# Patient Record
Sex: Female | Born: 1965 | Race: White | Hispanic: No | Smoking: Never smoker
Health system: Southern US, Community
[De-identification: ages and names within clinical notes are randomized; demographics above are authoritative.]

## PROBLEM LIST (undated history)

## (undated) DIAGNOSIS — R7989 Other specified abnormal findings of blood chemistry: Secondary | ICD-10-CM

## (undated) DIAGNOSIS — R7309 Other abnormal glucose: Secondary | ICD-10-CM

## (undated) DIAGNOSIS — T7840XA Allergy, unspecified, initial encounter: Secondary | ICD-10-CM

## (undated) HISTORY — DX: Other specified abnormal findings of blood chemistry: R79.89

## (undated) HISTORY — DX: Other abnormal glucose: R73.09

## (undated) HISTORY — DX: Allergy, unspecified, initial encounter: T78.40XA

---

## 1998-12-23 ENCOUNTER — Other Ambulatory Visit: Admission: RE | Admit: 1998-12-23 | Discharge: 1998-12-23 | Payer: Self-pay | Admitting: Obstetrics and Gynecology

## 1999-08-20 ENCOUNTER — Inpatient Hospital Stay (HOSPITAL_COMMUNITY): Admission: AD | Admit: 1999-08-20 | Discharge: 1999-08-23 | Payer: Self-pay | Admitting: Obstetrics and Gynecology

## 2000-01-02 ENCOUNTER — Other Ambulatory Visit: Admission: RE | Admit: 2000-01-02 | Discharge: 2000-01-02 | Payer: Self-pay | Admitting: Obstetrics and Gynecology

## 2001-10-20 ENCOUNTER — Other Ambulatory Visit: Admission: RE | Admit: 2001-10-20 | Discharge: 2001-10-20 | Payer: Self-pay | Admitting: Obstetrics and Gynecology

## 2001-10-20 ENCOUNTER — Other Ambulatory Visit: Admission: RE | Admit: 2001-10-20 | Discharge: 2001-10-20 | Payer: Self-pay | Admitting: *Deleted

## 2002-01-30 ENCOUNTER — Other Ambulatory Visit: Admission: RE | Admit: 2002-01-30 | Discharge: 2002-01-30 | Payer: Self-pay | Admitting: Obstetrics and Gynecology

## 2002-04-05 ENCOUNTER — Inpatient Hospital Stay (HOSPITAL_COMMUNITY): Admission: AD | Admit: 2002-04-05 | Discharge: 2002-04-05 | Payer: Self-pay | Admitting: Obstetrics and Gynecology

## 2002-04-06 ENCOUNTER — Inpatient Hospital Stay (HOSPITAL_COMMUNITY): Admission: AD | Admit: 2002-04-06 | Discharge: 2002-04-08 | Payer: Self-pay | Admitting: Obstetrics and Gynecology

## 2002-07-23 ENCOUNTER — Other Ambulatory Visit: Admission: RE | Admit: 2002-07-23 | Discharge: 2002-07-23 | Payer: Self-pay | Admitting: Obstetrics and Gynecology

## 2003-04-21 ENCOUNTER — Other Ambulatory Visit: Admission: RE | Admit: 2003-04-21 | Discharge: 2003-04-21 | Payer: Self-pay | Admitting: Obstetrics and Gynecology

## 2015-04-25 ENCOUNTER — Other Ambulatory Visit: Payer: Self-pay | Admitting: Obstetrics and Gynecology

## 2015-04-25 DIAGNOSIS — R928 Other abnormal and inconclusive findings on diagnostic imaging of breast: Secondary | ICD-10-CM

## 2015-04-28 ENCOUNTER — Ambulatory Visit
Admission: RE | Admit: 2015-04-28 | Discharge: 2015-04-28 | Disposition: A | Discharge status: Home / Self Care | Payer: BC Managed Care – PPO | Source: Ambulatory Visit | Attending: Obstetrics and Gynecology | Admitting: Obstetrics and Gynecology

## 2015-04-28 DIAGNOSIS — R928 Other abnormal and inconclusive findings on diagnostic imaging of breast: Secondary | ICD-10-CM

## 2015-05-04 ENCOUNTER — Other Ambulatory Visit: Payer: Self-pay | Admitting: Obstetrics and Gynecology

## 2015-05-04 DIAGNOSIS — R920 Mammographic microcalcification found on diagnostic imaging of breast: Secondary | ICD-10-CM

## 2015-11-15 ENCOUNTER — Ambulatory Visit
Admission: RE | Admit: 2015-11-15 | Discharge: 2015-11-15 | Disposition: A | Discharge status: Home / Self Care | Payer: BC Managed Care – PPO | Source: Ambulatory Visit | Attending: Obstetrics and Gynecology | Admitting: Obstetrics and Gynecology

## 2015-11-15 DIAGNOSIS — R920 Mammographic microcalcification found on diagnostic imaging of breast: Secondary | ICD-10-CM

## 2016-05-01 ENCOUNTER — Other Ambulatory Visit: Payer: Self-pay | Admitting: Obstetrics and Gynecology

## 2016-05-01 DIAGNOSIS — R921 Mammographic calcification found on diagnostic imaging of breast: Secondary | ICD-10-CM

## 2018-06-11 ENCOUNTER — Other Ambulatory Visit: Payer: Self-pay | Admitting: Obstetrics & Gynecology

## 2018-06-11 DIAGNOSIS — R921 Mammographic calcification found on diagnostic imaging of breast: Secondary | ICD-10-CM

## 2018-07-08 ENCOUNTER — Other Ambulatory Visit: Payer: Self-pay | Admitting: Obstetrics & Gynecology

## 2018-07-08 ENCOUNTER — Ambulatory Visit
Admission: RE | Admit: 2018-07-08 | Discharge: 2018-07-08 | Disposition: A | Discharge status: Home / Self Care | Payer: BC Managed Care – PPO | Source: Ambulatory Visit | Attending: Obstetrics & Gynecology | Admitting: Obstetrics & Gynecology

## 2018-07-08 DIAGNOSIS — R921 Mammographic calcification found on diagnostic imaging of breast: Secondary | ICD-10-CM

## 2018-07-08 LAB — HM MAMMOGRAPHY

## 2018-07-11 ENCOUNTER — Ambulatory Visit
Admission: RE | Admit: 2018-07-11 | Discharge: 2018-07-11 | Disposition: A | Discharge status: Home / Self Care | Payer: BC Managed Care – PPO | Source: Ambulatory Visit | Attending: Obstetrics & Gynecology | Admitting: Obstetrics & Gynecology

## 2018-07-11 ENCOUNTER — Other Ambulatory Visit: Payer: Self-pay | Admitting: Obstetrics & Gynecology

## 2018-07-11 DIAGNOSIS — R921 Mammographic calcification found on diagnostic imaging of breast: Secondary | ICD-10-CM

## 2018-07-11 HISTORY — PX: BIOPSY BREAST: PRO8

## 2018-07-17 ENCOUNTER — Encounter: Payer: Self-pay | Admitting: Family Medicine

## 2018-11-10 ENCOUNTER — Encounter: Payer: Self-pay | Admitting: Medical

## 2018-11-10 ENCOUNTER — Ambulatory Visit: Payer: BC Managed Care – PPO | Admitting: Medical

## 2018-11-10 VITALS — BP 110/72 | HR 78 | Temp 97.7°F | Resp 16 | Wt 123.8 lb

## 2018-11-10 DIAGNOSIS — H6983 Other specified disorders of Eustachian tube, bilateral: Secondary | ICD-10-CM | POA: Diagnosis not present

## 2018-11-10 DIAGNOSIS — J329 Chronic sinusitis, unspecified: Secondary | ICD-10-CM

## 2018-11-10 DIAGNOSIS — H938X3 Other specified disorders of ear, bilateral: Secondary | ICD-10-CM

## 2018-11-10 MED ORDER — AMOXICILLIN 875 MG PO TABS: 875.0000 mg | ORAL_TABLET | Freq: Two times a day (BID) | ORAL | 0 refills | Status: DC

## 2018-11-10 NOTE — Progress Notes (Signed)
Subjective:  Jennifer Williams is a 52 y.o. female who presents for  Chief Complaint  Patient presents with  . new pt    new pt, sore throat- tightness, ear pain- popping. over week.    Here as a new patient.  She reports 1+ week hx/o ear pressure, sinus pressure congestion, but no fever, no NVD, no cough.   Using sudafed but this is not helping.   She notes getting a few sinus infections yearly.  Ever since one bad sinusitis at age 49yo, she has chronic popping and pressure in ears on and off for years.  Last saw ENT years ago.  Already uses flonase regularly.  No other aggravating or relieving factors. No other complaint.  No past medical history on file.  No current outpatient medications on file prior to visit.   No current facility-administered medications on file prior to visit.     ROS as in subjective   Objective: BP 110/72   Pulse 78   Temp 97.7 F (36.5 C) (Oral)   Resp 16   Wt 123 lb 12.8 oz (56.2 kg)   SpO2 98%   General appearance: Alert, well developed, well nourished, no distress                             Skin: warm, no rash                           Head: sinuses mildly tender                            Eyes: conjunctiva pink, corneas clear                            Ears: flat left tympanic membrane, flat right tympanic membrane, external ear canals normal                          Nose: septum midline, turbinates swollen, with erythema and clear discharge             Mouth/throat: MMM, tongue normal, no pharyngeal erythema                           Neck: supple, no adenopathy, no thyromegaly, non tender                         Lungs: clear, no wheezes, no rales, no rhonchi        Assessment  Encounter Diagnoses  Name Primary?  . Dysfunction of both eustachian tubes Yes  . Popping of both ears   . Chronic sinusitis, unspecified location       Plan: discuses symptoms, concerns.  Begin round of Amoxicillin.  Use sudafed a few more days.  C/t flonase in  general.   Advised ENT consult given chronic ear pressure and popping.  She will consider consult.  discussed good water intake, regular swallowing to help with pressure in ears.     Discussed diagnosis of sinusitis.   Discussed usual time frame to see improvement. Discussed possible complications or symptoms that would prompt call back or recheck within the next few days.     Patient was advised to call or return if worse or not  improving in the next few days.    Patient voiced understanding of diagnosis, recommendations, and treatment plan.  Jennifer Williams was seen today for new pt.  Diagnoses and all orders for this visit:  Dysfunction of both eustachian tubes  Popping of both ears  Chronic sinusitis, unspecified location  Other orders -     amoxicillin (AMOXIL) 875 MG tablet; Take 1 tablet (875 mg total) by mouth 2 (two) times daily.

## 2019-01-12 ENCOUNTER — Other Ambulatory Visit: Payer: Self-pay | Admitting: Obstetrics & Gynecology

## 2019-01-12 DIAGNOSIS — N6011 Diffuse cystic mastopathy of right breast: Secondary | ICD-10-CM

## 2019-01-20 ENCOUNTER — Ambulatory Visit
Admission: RE | Admit: 2019-01-20 | Discharge: 2019-01-20 | Disposition: A | Discharge status: Home / Self Care | Payer: BC Managed Care – PPO | Source: Ambulatory Visit | Attending: Obstetrics & Gynecology | Admitting: Obstetrics & Gynecology

## 2019-01-20 ENCOUNTER — Encounter: Payer: Self-pay | Admitting: Family Medicine

## 2019-01-20 DIAGNOSIS — N6011 Diffuse cystic mastopathy of right breast: Secondary | ICD-10-CM

## 2019-01-26 ENCOUNTER — Other Ambulatory Visit: Payer: Self-pay

## 2019-01-26 ENCOUNTER — Ambulatory Visit: Payer: BC Managed Care – PPO | Admitting: Sports Medicine

## 2019-01-26 ENCOUNTER — Encounter: Payer: Self-pay | Admitting: Sports Medicine

## 2019-01-26 VITALS — BP 104/68 | Ht 68.0 in | Wt 122.0 lb

## 2019-01-26 DIAGNOSIS — M67951 Unspecified disorder of synovium and tendon, right thigh: Secondary | ICD-10-CM

## 2019-01-26 DIAGNOSIS — M6798 Unspecified disorder of synovium and tendon, other site: Secondary | ICD-10-CM

## 2019-01-26 NOTE — Patient Instructions (Signed)
You have gluteus medius tendinopathy.  We have shown you some stretches and exercises that should help with your pain and discomfort. Please do these as often as you are able.   Please follow up in 6 weeks so we can see how things are going.

## 2019-01-26 NOTE — Progress Notes (Signed)
   Subjective:    Patient ID: Jennifer Williams, female    DOB: [birth year], 53 y.o.   MRN: [id number]  Patient here as a new patient for right hip pain.  Patient states that she has had hip pain in her right hip for the last 5 years.  States it is over the lateral aspect of her hip.  Does not remember any inciting factor or trauma that caused the pain.  Patient reports that she thought she just worked out too hard when she started developing it.  She reports that for the past 2 months however this pain has gotten worse which is caused her to come in today.  Patient denies any numbness, tingling, weakness.  Patient states that she also has occasional on and off pain on the inside of her right thigh which is better today and she is not concerned with this at this time.  She believes it might be related to the pain in her hip.  Patient reports that she is very active in yoga and there a couple of poses that causes her pain to hurt worse than others.   ROS as per HPI    Objective:   Gen: NAD, comfortable in exam room   R hip: No gross deformity, scoliosis. No TTP on lateral hip hip.  No midline or bony TTP. FROM without pain on hip  Strength LEs 5/5 all muscle groups.   Sensation intact to light touch bilaterally. Negative straight leg test Trendelenburg positive on the R     Assessment & Plan:   Right gluteus medius tendinopathy Patient with pain over her right greater trochanter that has likely secondary to weakness of her gluteus medius. Patient with positive Trendelenburg.  No signs of piriformis syndrome at this time. Patient shown stretches and exercises for her to complete in order to strengthen this muscle.  She may use ibuprofen and acetaminophen as needed for pain.  She is to follow-up in 6 weeks to reevaluate how her pain and strengthening exercises have gone.  [place] Jameire Kouba, DO PGY-2, [facility]   Patient seen and evaluated with the resident.  I agree with the above  plan of care.  Patient has definite weakness of her pelvic stabilizers on the right.  She is had symptoms for several years.  We are going to start with a home exercise program and I will see her again in 6 weeks.  I do not feel the need for imaging at this time as her hip range of motion is full and painless.  Signs and symptoms are not suggestive of lumbar radiculopathy either.  Patient is encouraged to call with questions or concerns prior to her follow-up visit.

## 2019-03-03 ENCOUNTER — Ambulatory Visit: Payer: BC Managed Care – PPO | Admitting: Sports Medicine

## 2019-03-03 ENCOUNTER — Other Ambulatory Visit: Payer: Self-pay

## 2019-03-03 ENCOUNTER — Encounter: Payer: Self-pay | Admitting: Sports Medicine

## 2019-03-03 VITALS — BP 129/73 | HR 75 | Temp 98.3°F | Ht 68.0 in | Wt 122.0 lb

## 2019-03-03 DIAGNOSIS — M67951 Unspecified disorder of synovium and tendon, right thigh: Secondary | ICD-10-CM

## 2019-03-03 DIAGNOSIS — M6798 Unspecified disorder of synovium and tendon, other site: Secondary | ICD-10-CM

## 2019-03-04 NOTE — Progress Notes (Signed)
   Subjective:    Patient ID: Jennifer Williams, female    DOB: [birth year], 53 y.o.   MRN: [id number]  HPI   Jennifer Williams comes in today for follow-up on right hip pain.  Overall, her symptoms have improved.  She has been diligent about doing her home exercises.  She still gets occasional pain but nothing like she was experiencing previously.  She did get a little sore this weekend after doing some yard work.  She continues to deny groin pain.  No radiating pain down the leg.  No numbness or tingling.   Review of Systems As above    Objective:   Physical Exam  Well-developed, well-nourished.  No acute distress.  Awake alert and oriented x3.  Vital signs reviewed  Right hip: Smooth painless hip range of motion with a negative logroll.  She still has a slight Trendelenburg on the right.  Also noted is a slight Trendelenburg on the left.  No tenderness to palpation.  Negative straight leg raise.  Neurovascularly intact distally.      Assessment & Plan:   Improving right hip pain secondary to pelvic stabilizer weakness  Patient will continue with her home exercises.  She is making good progress.  I have also encouraged her to start doing the exercises for her left hip.  I think she may continue with activity as tolerated using pain as her guide.  Follow-up as needed.

## 2019-03-04 NOTE — Progress Notes (Signed)
This is your patient. Not sure why it was forwarded to me but here you go. Thanks.

## 2019-03-16 NOTE — Progress Notes (Signed)
Please call and offer a physical .  I have seen her once for acute issue, but not sure when her last physical was?

## 2019-03-17 NOTE — Progress Notes (Signed)
When she scheduled  she she said she wanted to be a vickie pt but she needed to come in for a sick visit is it ok to schedule with you

## 2019-03-18 NOTE — Progress Notes (Signed)
Pt is coming in June 5

## 2019-04-13 NOTE — Progress Notes (Signed)
Subjective:    Patient ID: Jennifer Williams, female    DOB: 09-23-1966, 53 y.o.   MRN: [id number]  HPI Chief Complaint  Patient presents with  . fasting cpe    fasting cpe   She is here for a complete physical exam. She also has chronic health concerns including elevated creatinine and Hgb A1c of 5.7% in February 2020.  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   Previous medical care: only at her OB/GYN  Other providers: [facility]   Environmental allergies- takes Zyrtec   Complains of a cyst on the 3rd finger of her left hand. She will let me know if she wants to do something about this.   States she also has a mole on her left upper back that may have grown. It bothers her at times. Does not have a dermatologist.   Social history: Lives with her 3 children. Widowed.   Works as a Associate Professor for [employer]  Denies smoking, drinking alcohol, drug use  Diet: fairly healthy  Excerise: walks, pilates, yoga   Immunizations: Tdap in the past 10 years   Health maintenance:  Mammogram: August 2019  Colonoscopy: never  Last Gynecological Exam: pap smear August 2019 and has IUD  Last Menstrual cycle: not with IUD Last Dental Exam: 2 weeks ago  Last Eye Exam: 5 years ago. Wears reading glasses   Wears seatbelt always, uses sunscreen, smoke detectors in home and functioning, does not text while driving and feels safe in home environment.   Reviewed allergies, medications, past medical, surgical, family, and social history.   Review of Systems Review of Systems Constitutional: -fever, -chills, -sweats, -unexpected weight change,-fatigue ENT: -runny nose, -ear pain, -sore throat Cardiology:  -chest pain, -palpitations, -edema Respiratory: -cough, -shortness of breath, -wheezing Gastroenterology: -abdominal pain, -nausea, -vomiting, -diarrhea, -constipation   Hematology: -bleeding or bruising problems Musculoskeletal: -arthralgias, -myalgias, -joint swelling, -back pain Ophthalmology: -vision changes Urology: -dysuria, -difficulty urinating, -hematuria, -urinary frequency, -urgency Neurology: -headache, -weakness, -tingling, -numbness       Objective:   Physical Exam BP 110/60   Pulse 64   Temp 97.8 F (36.6 C) (Oral)   Ht 5\' 8"  (1.727 m)   Wt 124 lb (56.2 kg)   BMI 18.85 kg/m   General Appearance:    Alert, cooperative, no distress, appears stated age  Head:    Normocephalic, without obvious abnormality, atraumatic  Eyes:    PERRL, conjunctiva/corneas clear, EOM's intact, fundi    benign  Ears:    Normal TM's and external ear canals  Nose:   Nares normal, mucosa normal, no drainage or sinus   tenderness  Throat:   Lips, mucosa, and tongue normal; teeth and gums normal  Neck:   Supple, no lymphadenopathy;  thyroid:  no   enlargement/tenderness/nodules; no carotid   bruit or JVD  Back:    Spine nontender, no curvature, ROM normal, no CVA     tenderness  Lungs:     Clear to auscultation bilaterally without wheezes, rales or     ronchi; respirations unlabored  Chest Wall:    No tenderness or deformity   Heart:    Regular rate and rhythm, S1 and S2 normal, no murmur, rub   or gallop  Breast Exam:    OB/GYN  Abdomen:     Soft, non-tender, nondistended, normoactive bowel sounds,  no masses, no hepatosplenomegaly  Genitalia:    OB/GYN     Extremities:   No clubbing, cyanosis or edema  Pulses:   2+ and symmetric all extremities  Skin:   Skin color, texture, turgor normal, no rashes. 1 cm round, raised flesh toned papule with dark pigmentation on her left upper back. 0.5 cm round, smooth, moveable, non tender cyst on the 3rd finger of her left hand.   Lymph nodes:   Cervical, supraclavicular, and axillary nodes normal  Neurologic:   CNII-XII intact, normal strength, sensation and gait; reflexes 2+ and symmetric throughout           Psych:   Normal mood, affect, hygiene and grooming.    UA negative      Assessment & Plan:  Routine general medical examination at a health care facility - Plan: CBC with Differential/Platelet, Comprehensive metabolic panel, TSH, Lipid panel, POCT Urinalysis DIP (Proadvantage Device)  Screen for colon cancer - Plan: Ambulatory referral to Gastroenterology  Atypical nevi - Plan: Ambulatory referral to Dermatology  Elevated serum creatinine - Plan: Comprehensive metabolic panel, POCT Urinalysis DIP (Proadvantage Device)  Elevated hemoglobin A1c measurement - Plan: Hemoglobin A1c  Ganglion cyst of finger  She is new to me today. A pleasant 53 year old female who is in her usual state of health and doing well overall.  Recent evaluation to see if she is a candidate to donate a kidney uncovered decreased renal function and prediabetes. We will need to monitor this.  UTD with pap smear and mammogram.  Referral to GI for her first screening colonoscopy.  Referral to dermatology for evaluation of lesion on left upper back.  She will let me know if the cyst on her finger is bothersome or changing.  Follow up pending labs.

## 2019-04-14 ENCOUNTER — Encounter: Payer: Self-pay | Admitting: Family Medicine

## 2019-04-14 ENCOUNTER — Other Ambulatory Visit: Payer: Self-pay

## 2019-04-14 ENCOUNTER — Encounter: Payer: Self-pay | Admitting: Gastroenterology

## 2019-04-14 ENCOUNTER — Ambulatory Visit: Payer: BC Managed Care – PPO | Admitting: Family Medicine

## 2019-04-14 VITALS — BP 110/60 | HR 64 | Temp 97.8°F | Ht 68.0 in | Wt 124.0 lb

## 2019-04-14 DIAGNOSIS — Z1211 Encounter for screening for malignant neoplasm of colon: Secondary | ICD-10-CM

## 2019-04-14 DIAGNOSIS — Z0000 Encounter for general adult medical examination without abnormal findings: Secondary | ICD-10-CM

## 2019-04-14 DIAGNOSIS — D229 Melanocytic nevi, unspecified: Secondary | ICD-10-CM

## 2019-04-14 DIAGNOSIS — R7309 Other abnormal glucose: Secondary | ICD-10-CM

## 2019-04-14 DIAGNOSIS — M67449 Ganglion, unspecified hand: Secondary | ICD-10-CM | POA: Insufficient documentation

## 2019-04-14 DIAGNOSIS — R7989 Other specified abnormal findings of blood chemistry: Secondary | ICD-10-CM

## 2019-04-14 LAB — POCT URINALYSIS DIP (PROADVANTAGE DEVICE)
Bilirubin, UA: NEGATIVE
Blood, UA: NEGATIVE
Glucose, UA: NEGATIVE mg/dL
Ketones, POC UA: NEGATIVE mg/dL
Leukocytes, UA: NEGATIVE
Nitrite, UA: NEGATIVE
Protein Ur, POC: NEGATIVE mg/dL
Specific Gravity, Urine: 1.01
Urobilinogen, Ur: NEGATIVE
pH, UA: 7 (ref 5.0–8.0)

## 2019-04-14 LAB — LIPID PANEL

## 2019-04-14 NOTE — Patient Instructions (Addendum)
It was a pleasure seeing you today.  You will receive a call from [facility] dermatology to schedule an appointment about the area on your back.  [facility] will call you to schedule an appointment to discuss screening colonoscopy.  If you determine that you are overdue for Tdap, you can call our office to schedule a nurse visit to get this. As we discussed, Shingrix is a 2 shot series to help protect against shingles. You can check with your insurance to see if this is affordable if you decide to get this.   Continue taking good care of yourself.  We will call you with your lab results.   Preventive Care 40-64 Years, Female Preventive care refers to lifestyle choices and visits with your health care provider that can promote health and wellness. What does preventive care include?   A yearly physical exam. This is also called an annual well check.  Dental exams once or twice a year.  Routine eye exams. Ask your health care provider how often you should have your eyes checked.  Personal lifestyle choices, including: ? Daily care of your teeth and gums. ? Regular physical activity. ? Eating a healthy diet. ? Avoiding tobacco and drug use. ? Limiting alcohol use. ? Practicing safe sex. ? Taking low-dose aspirin daily starting at age 34. ? Taking vitamin and mineral supplements as recommended by your health care provider. What happens during an annual well check? The services and screenings done by your health care provider during your annual well check will depend on your age, overall health, lifestyle risk factors, and family history of disease. Counseling Your health care provider may ask you questions about your:  Alcohol use.  Tobacco use.  Drug use.  Emotional well-being.  Home and relationship well-being.  Sexual activity.  Eating habits.  Work and work Statistician.  Method of birth control.  Menstrual cycle.  Pregnancy history. Screening You may have the  following tests or measurements:  Height, weight, and BMI.  Blood pressure.  Lipid and cholesterol levels. These may be checked every 5 years, or more frequently if you are over 4 years old.  Skin check.  Lung cancer screening. You may have this screening every year starting at age 52 if you have a 30-pack-year history of smoking and currently smoke or have quit within the past 15 years.  Colorectal cancer screening. All adults should have this screening starting at age 5 and continuing until age 84. Your health care provider may recommend screening at age 57. You will have tests every 1-10 years, depending on your results and the type of screening test. People at increased risk should start screening at an earlier age. Screening tests may include: ? Guaiac-based fecal occult blood testing. ? Fecal immunochemical test (FIT). ? Stool DNA test. ? Virtual colonoscopy. ? Sigmoidoscopy. During this test, a flexible tube with a tiny camera (sigmoidoscope) is used to examine your rectum and lower colon. The sigmoidoscope is inserted through your anus into your rectum and lower colon. ? Colonoscopy. During this test, a long, thin, flexible tube with a tiny camera (colonoscope) is used to examine your entire colon and rectum.  Hepatitis C blood test.  Hepatitis B blood test.  Sexually transmitted disease (STD) testing.  Diabetes screening. This is done by checking your blood sugar (glucose) after you have not eaten for a while (fasting). You may have this done every 1-3 years.  Mammogram. This may be done every 1-2 years. Talk to your health  care provider about when you should start having regular mammograms. This may depend on whether you have a family history of breast cancer.  BRCA-related cancer screening. This may be done if you have a family history of breast, ovarian, tubal, or peritoneal cancers.  Pelvic exam and Pap test. This may be done every 3 years starting at age 28. Starting  at age 77, this may be done every 5 years if you have a Pap test in combination with an HPV test.  Bone density scan. This is done to screen for osteoporosis. You may have this scan if you are at high risk for osteoporosis. Discuss your test results, treatment options, and if necessary, the need for more tests with your health care provider. Vaccines Your health care provider may recommend certain vaccines, such as:  Influenza vaccine. This is recommended every year.  Tetanus, diphtheria, and acellular pertussis (Tdap, Td) vaccine. You may need a Td booster every 10 years.  Varicella vaccine. You may need this if you have not been vaccinated.  Zoster vaccine. You may need this after age 45.  Measles, mumps, and rubella (MMR) vaccine. You may need at least one dose of MMR if you were born in 1957 or later. You may also need a second dose.  Pneumococcal 13-valent conjugate (PCV13) vaccine. You may need this if you have certain conditions and were not previously vaccinated.  Pneumococcal polysaccharide (PPSV23) vaccine. You may need one or two doses if you smoke cigarettes or if you have certain conditions.  Meningococcal vaccine. You may need this if you have certain conditions.  Hepatitis A vaccine. You may need this if you have certain conditions or if you travel or work in places where you may be exposed to hepatitis A.  Hepatitis B vaccine. You may need this if you have certain conditions or if you travel or work in places where you may be exposed to hepatitis B.  Haemophilus influenzae type b (Hib) vaccine. You may need this if you have certain conditions. Talk to your health care provider about which screenings and vaccines you need and how often you need them. This information is not intended to replace advice given to you by your health care provider. Make sure you discuss any questions you have with your health care provider. Document Released: 11/25/2015 Document Revised:  12/19/2017 Document Reviewed: 08/30/2015 Elsevier Interactive Patient Education  2019 [employer].

## 2019-04-15 LAB — COMPREHENSIVE METABOLIC PANEL
ALT: 15 IU/L (ref 0–32)
AST: 20 IU/L (ref 0–40)
Albumin/Globulin Ratio: 2.3 — ABNORMAL HIGH (ref 1.2–2.2)
Albumin: 4.9 g/dL — ABNORMAL HIGH (ref 3.8–4.8)
Alkaline Phosphatase: 48 IU/L (ref 39–117)
BUN/Creatinine Ratio: 10 (ref 9–23)
BUN: 11 mg/dL (ref 6–24)
Bilirubin Total: 0.7 mg/dL (ref 0.0–1.2)
CO2: 29 mmol/L (ref 20–29)
Calcium: 10 mg/dL (ref 8.7–10.2)
Chloride: 101 mmol/L (ref 96–106)
Creatinine, Ser: 1.07 mg/dL — ABNORMAL HIGH (ref 0.57–1.00)
GFR calc Af Amer: 70 mL/min/{1.73_m2} (ref >59)
GFR calc non Af Amer: 61 mL/min/{1.73_m2} (ref >59)
Globulin, Total: 2.1 g/dL (ref 1.5–4.5)
Glucose: 78 mg/dL (ref 65–99)
Potassium: 4.4 mmol/L (ref 3.5–5.2)
Sodium: 144 mmol/L (ref 134–144)
Total Protein: 7 g/dL (ref 6.0–8.5)

## 2019-04-15 LAB — CBC WITH DIFFERENTIAL/PLATELET
Basophils Absolute: 0.1 10*3/uL (ref 0.0–0.2)
Basos: 3 %
EOS (ABSOLUTE): 0.3 10*3/uL (ref 0.0–0.4)
Eos: 7 %
Hematocrit: 41.2 % (ref 34.0–46.6)
Hemoglobin: 13.9 g/dL (ref 11.1–15.9)
Immature Grans (Abs): 0 10*3/uL (ref 0.0–0.1)
Immature Granulocytes: 0 %
Lymphocytes Absolute: 2 10*3/uL (ref 0.7–3.1)
Lymphs: 47 %
MCH: 30.3 pg (ref 26.6–33.0)
MCHC: 33.7 g/dL (ref 31.5–35.7)
MCV: 90 fL (ref 79–97)
Monocytes Absolute: 0.2 10*3/uL (ref 0.1–0.9)
Monocytes: 6 %
Neutrophils Absolute: 1.6 10*3/uL (ref 1.4–7.0)
Neutrophils: 37 %
Platelets: 235 10*3/uL (ref 150–450)
RBC: 4.58 x10E6/uL (ref 3.77–5.28)
RDW: 12.5 % (ref 11.7–15.4)
WBC: 4.2 10*3/uL (ref 3.4–10.8)

## 2019-04-15 LAB — LIPID PANEL
Chol/HDL Ratio: 2.7 ratio (ref 0.0–4.4)
Cholesterol, Total: 206 mg/dL — ABNORMAL HIGH (ref 100–199)
HDL: 75 mg/dL (ref >39)
LDL Calculated: 120 mg/dL — ABNORMAL HIGH (ref 0–99)
Triglycerides: 55 mg/dL (ref 0–149)
VLDL Cholesterol Cal: 11 mg/dL (ref 5–40)

## 2019-04-15 LAB — TSH: TSH: 0.867 u[IU]/mL (ref 0.450–4.500)

## 2019-04-15 LAB — HEMOGLOBIN A1C
Est. average glucose Bld gHb Est-mCnc: 120 mg/dL
Hgb A1c MFr Bld: 5.8 % — ABNORMAL HIGH (ref 4.8–5.6)

## 2019-04-16 ENCOUNTER — Other Ambulatory Visit: Payer: Self-pay | Admitting: Family Medicine

## 2019-04-16 ENCOUNTER — Telehealth: Payer: Self-pay | Admitting: Family Medicine

## 2019-04-16 MED ORDER — NITROFURANTOIN MONOHYD MACRO 100 MG PO CAPS: 100.0000 mg | ORAL_CAPSULE | Freq: Two times a day (BID) | ORAL | 0 refills | Status: DC

## 2019-04-16 NOTE — Telephone Encounter (Signed)
Pt called and states she thinks she has a UTI states it is painful to urinate and burns when she pees and she feels like she has to go every 5 minutes, pt is wanting to know if you will send her something in pt uses [id number] [place], [place] informed pt that you was not in the office today pt can be reached at [phone]

## 2019-04-16 NOTE — Telephone Encounter (Signed)
Informed pt .

## 2019-04-16 NOTE — Telephone Encounter (Signed)
I will send in an antibiotic. If symptoms do not resolve or if she has a recurrence of symptoms in the next few weeks, I will recommend that she come in for a urine check.

## 2019-05-12 ENCOUNTER — Ambulatory Visit (AMBULATORY_SURGERY_CENTER): Payer: Self-pay | Admitting: *Deleted

## 2019-05-12 ENCOUNTER — Other Ambulatory Visit: Payer: Self-pay

## 2019-05-12 VITALS — Ht 68.0 in | Wt 122.0 lb

## 2019-05-12 DIAGNOSIS — Z1211 Encounter for screening for malignant neoplasm of colon: Secondary | ICD-10-CM

## 2019-05-12 MED ORDER — NA SULFATE-K SULFATE-MG SULF 17.5-3.13-1.6 GM/177ML PO SOLN: ORAL | 0 refills | Status: DC

## 2019-05-12 NOTE — Progress Notes (Signed)
Patient's pre-visit was done today over the phone with the patient due to COVID-19 pandemic. Name,DOB and address verified. Insurance verified. Packet of Prep instructions mailed to patient including copy of a consent form and pre-procedure patient acknowledgement form-pt is aware. Suprep $15 Coupon included. Patient understands to call us back with any questions or concerns. Patient denies any allergies to eggs or soy. Patient denies any past surgeries. Patient denies any oxygen use at home. Patient denies taking any diet/weight loss medications or blood thinners. EMMI education assisgned to patient on colonoscopy, this was explained and instructions given to patient. Pt is aware that care partner will wait in the car in the parking lot; if they feel like they will be too hot to wait in the car; they may wait in the lobby.  We want them to wear a mask (we do not have any that we can provide them), practice social distancing, and we will check their temperatures when they get here.  I did remind patient that their care partner needs to stay in the parking lot the entire time. Pt will wear mask into building

## 2019-05-13 ENCOUNTER — Encounter: Payer: Self-pay | Admitting: Gastroenterology

## 2019-05-25 ENCOUNTER — Telehealth: Payer: Self-pay | Admitting: Gastroenterology

## 2019-05-25 NOTE — Telephone Encounter (Signed)

## 2019-05-26 ENCOUNTER — Ambulatory Visit (AMBULATORY_SURGERY_CENTER): Payer: BC Managed Care – PPO | Admitting: Gastroenterology

## 2019-05-26 ENCOUNTER — Other Ambulatory Visit: Payer: Self-pay

## 2019-05-26 ENCOUNTER — Encounter: Payer: Self-pay | Admitting: Gastroenterology

## 2019-05-26 VITALS — BP 109/70 | HR 64 | Temp 98.7°F | Resp 11 | Ht 68.0 in | Wt 124.0 lb

## 2019-05-26 DIAGNOSIS — D120 Benign neoplasm of cecum: Secondary | ICD-10-CM

## 2019-05-26 DIAGNOSIS — D122 Benign neoplasm of ascending colon: Secondary | ICD-10-CM

## 2019-05-26 DIAGNOSIS — Z1211 Encounter for screening for malignant neoplasm of colon: Secondary | ICD-10-CM

## 2019-05-26 DIAGNOSIS — D128 Benign neoplasm of rectum: Secondary | ICD-10-CM

## 2019-05-26 MED ORDER — SODIUM CHLORIDE 0.9 % IV SOLN: 500.0000 mL | Freq: Once | INTRAVENOUS | Status: DC

## 2019-05-26 NOTE — Progress Notes (Signed)
Pt's states no medical or surgical changes since previsit or office visit. 

## 2019-05-26 NOTE — Op Note (Signed)
[facility] Patient Name: Jennifer Williams Procedure Date: 05/26/2019 10:51 AM MRN: [id number] Endoscopist: Thornton Park MD, MD Age: 53 Referring MD:  Date of Birth: 1966-07-31 Gender: Female Account #: [id number] Procedure:                Colonoscopy Indications:              Screening for colorectal malignant neoplasm, This                            is the patient's first colonoscopy                           No known family history of colon cancer or polyps                           No baseline GI symptoms Medicines:                See the Anesthesia note for documentation of the                            administered medications Procedure:                Pre-Anesthesia Assessment:                           - Prior to the procedure, a History and Physical                            was performed, and patient medications and                            allergies were reviewed. The patient's tolerance of                            previous anesthesia was also reviewed. The risks                            and benefits of the procedure and the sedation                            options and risks were discussed with the patient.                            All questions were answered, and informed consent                            was obtained. Prior Anticoagulants: The patient has                            taken no previous anticoagulant or antiplatelet                            agents. ASA Grade Assessment: I - A normal, healthy  patient. After reviewing the risks and benefits,                            the patient was deemed in satisfactory condition to                            undergo the procedure.                           After obtaining informed consent, the colonoscope                            was passed under direct vision. Throughout the                            procedure, the patient's blood pressure, pulse, and                oxygen saturations were monitored continuously. The                            Colonoscope was introduced through the anus and                            advanced to the the terminal ileum, with                            identification of the appendiceal orifice and IC                            valve. The colonoscopy was technically difficult                            and complex due to a redundant colon, significant                            looping, a tortuous colon and the patient's body                            habitus. Successful completion of the procedure was                            aided by applying abdominal pressure. The patient                            tolerated the procedure well. The quality of the                            bowel preparation was good. The terminal ileum,                            ileocecal valve, appendiceal orifice, and rectum                            were photographed. Scope In: 11:00:08  AM Scope Out: 11:23:52 AM Scope Withdrawal Time: 0 hours 11 minutes 46 seconds  Total Procedure Duration: 0 hours 23 minutes 44 seconds  Findings:                 The perianal and digital rectal examinations were                            normal.                           A diffuse area of moderate melanosis was found in                            the entire colon.                           A 2 mm polyp was found in the cecum. The polyp was                            sessile. The polyp was removed with a cold snare.                            Resection and retrieval were complete. Estimated                            blood loss was minimal.                           A 3 mm polyp was found in the ascending colon. The                            polyp was sessile. The polyp was removed with a                            cold snare. Resection and retrieval were complete.                            Estimated blood loss was minimal.                            A 1 mm polyp was found in the rectum. The polyp was                            sessile. The polyp was removed with a cold biopsy                            forceps. Resection and retrieval were complete.                           The exam was otherwise without abnormality on                            direct and retroflexion views. Complications:  No immediate complications. Estimated blood loss:                            Minimal. Estimated Blood Loss:     Estimated blood loss was minimal. Impression:               - Melanosis in the colon.                           - One 2 mm polyp in the cecum, removed with a cold                            snare. Resected and retrieved.                           - One 3 mm polyp in the ascending colon, removed                            with a cold snare. Resected and retrieved.                           - One 1 mm polyp in the rectum, removed with a cold                            biopsy forceps. Resected and retrieved.                           - The examination was otherwise normal on direct                            and retroflexion views. Recommendation:           - Patient has a contact number available for                            emergencies. The signs and symptoms of potential                            delayed complications were discussed with the                            patient. Return to normal activities tomorrow.                            Written discharge instructions were provided to the                            patient.                           - Resume regular diet.                           - Continue present medications.                           -  Await pathology results.                           - Repeat colonoscopy in 7 months for surveillance                            if 1 or 2 polyps are adenomas. If all three polyps                            are adenomas, repeat colonoscopy in 3 years. Thornton Park MD, MD 05/26/2019 11:34:20 AM This report has been signed electronically.

## 2019-05-26 NOTE — Progress Notes (Signed)
PT taken to PACU. Monitors in place. VSS. Report given to RN. 

## 2019-05-26 NOTE — Progress Notes (Signed)
Called to room to assist during endoscopic procedure.  Patient ID and intended procedure confirmed with present staff. Received instructions for my participation in the procedure from the performing physician.  

## 2019-05-26 NOTE — Patient Instructions (Signed)
Thank you for allowing [place] to care for you today!  Await pathology results by mail, approximately 2 weeks.  Recommendations will be made at that time when to have your next colonoscopy,  Resume previous diet and medications today.  Return to your normal activities tomorrow.     YOU HAD AN ENDOSCOPIC PROCEDURE TODAY AT [facility]:   Refer to the procedure report that was given to you for any specific questions about what was found during the examination.  If the procedure report does not answer your questions, please call your gastroenterologist to clarify.  If you requested that your care partner not be given the details of your procedure findings, then the procedure report has been included in a sealed envelope for you to review at your convenience later.  YOU SHOULD EXPECT: Some feelings of bloating in the abdomen. Passage of more gas than usual.  Walking can help get rid of the air that was put into your GI tract during the procedure and reduce the bloating. If you had a lower endoscopy (such as a colonoscopy or flexible sigmoidoscopy) you may notice spotting of blood in your stool or on the toilet paper. If you underwent a bowel prep for your procedure, you may not have a normal bowel movement for a few days.  Please Note:  You might notice some irritation and congestion in your nose or some drainage.  This is from the oxygen used during your procedure.  There is no need for concern and it should clear up in a day or so.  SYMPTOMS TO REPORT IMMEDIATELY:   Following lower endoscopy (colonoscopy or flexible sigmoidoscopy):  Excessive amounts of blood in the stool  Significant tenderness or worsening of abdominal pains  Swelling of the abdomen that is new, acute  Fever of 100F or higher   For urgent or emergent issues, a gastroenterologist can be reached at any hour by calling [phone].   DIET:  We do recommend a small meal at first, but then you may proceed to  your regular diet.  Drink plenty of fluids but you should avoid alcoholic beverages for 24 hours.  ACTIVITY:  You should plan to take it easy for the rest of today and you should NOT DRIVE or use heavy machinery until tomorrow (because of the sedation medicines used during the test).    FOLLOW UP: Our staff will call the number listed on your records 48-72 hours following your procedure to check on you and address any questions or concerns that you may have regarding the information given to you following your procedure. If we do not reach you, we will leave a message.  We will attempt to reach you two times.  During this call, we will ask if you have developed any symptoms of COVID 19. If you develop any symptoms (ie: fever, flu-like symptoms, shortness of breath, cough etc.) before then, please call [phone].  If you test positive for Covid 19 in the 2 weeks post procedure, please call and report this information to [place].    If any biopsies were taken you will be contacted by phone or by letter within the next 1-3 weeks.  Please call us at [phone] if you have not heard about the biopsies in 3 weeks.    SIGNATURES/CONFIDENTIALITY: You and/or your care partner have signed paperwork which will be entered into your electronic medical record.  These signatures attest to the fact that that the information above on your After  Visit Summary has been reviewed and is understood.  Full responsibility of the confidentiality of this discharge information lies with you and/or your care-partner. 

## 2019-05-28 ENCOUNTER — Telehealth: Payer: Self-pay

## 2019-05-28 NOTE — Telephone Encounter (Signed)
  Follow up Call-  Call back number 05/26/2019  Post procedure Call Back phone  # [phone]  Permission to leave phone message Yes  Some recent data might be hidden     Patient questions:  Do you have a fever, pain , or abdominal swelling? No. Pain Score  0 *  Have you tolerated food without any problems? Yes.    Have you been able to return to your normal activities? Yes.    Do you have any questions about your discharge instructions: Diet   No. Medications  No. Follow up visit  No.  Do you have questions or concerns about your Care? No.  Actions: * If pain score is 4 or above: No action needed, pain <4. 1. Have you developed a fever since your procedure? no  2.   Have you had an respiratory symptoms (SOB or cough) since your procedure? no  3.   Have you tested positive for COVID 19 since your procedure no  4.   Have you had any family members/close contacts diagnosed with the COVID 19 since your procedure?  no   If yes to any of these questions please route to Joylene John, RN and Alphonsa Gin, Therapist, sports.

## 2019-05-31 ENCOUNTER — Encounter: Payer: Self-pay | Admitting: Gastroenterology

## 2019-10-25 IMAGING — MG DIGITAL DIAGNOSTIC UNILATERAL RIGHT MAMMOGRAM WITH TOMO AND CAD
6 series · 6 of 14 positions shown · non-contrast
Comparison: Previous exam(s).

CLINICAL DATA: Patient underwent biopsy of calcifications in the
right breast. Two areas were biopsied, both on 07/11/2018, 1 area in
the upper inner breast and the other area in the upper outer
breast.Both areas revealed benign concordant pathology. In addition,
these calcifications on the right have been followed with diagnostic
imaging since April 2015. Patient has no current breast complaints.

EXAM:
DIGITAL DIAGNOSTIC UNILATERAL RIGHT MAMMOGRAM WITH CAD AND TOMO

[R CC]
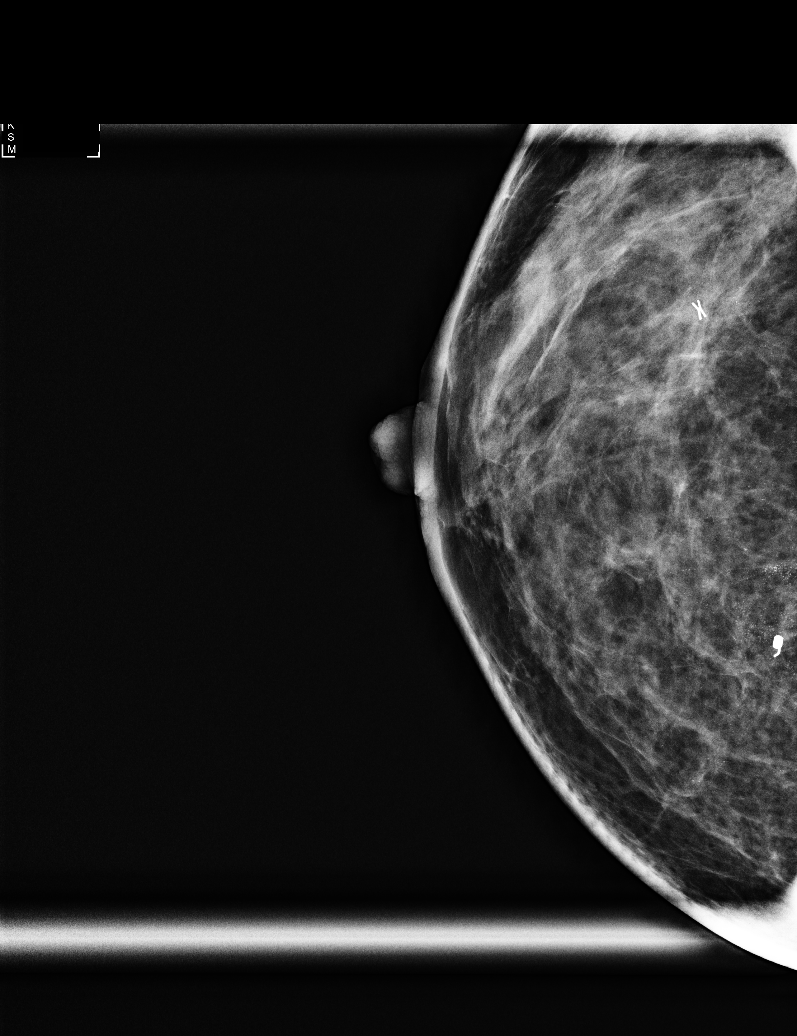

[R ML]
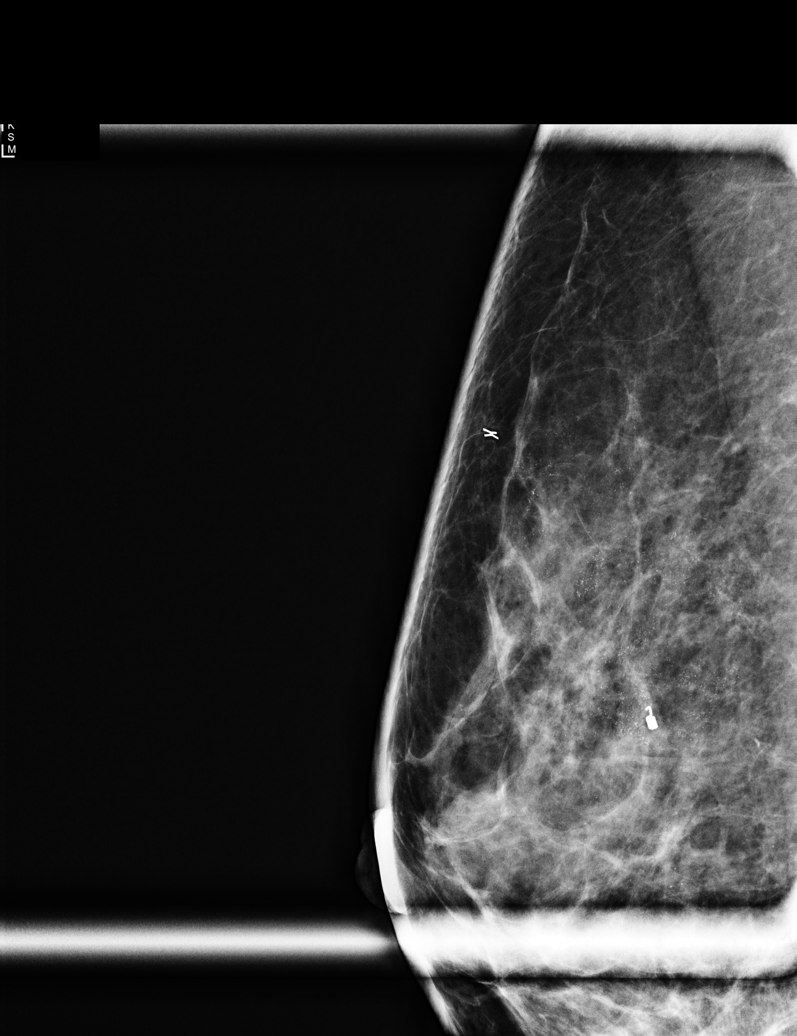

[R CC synth-2D]
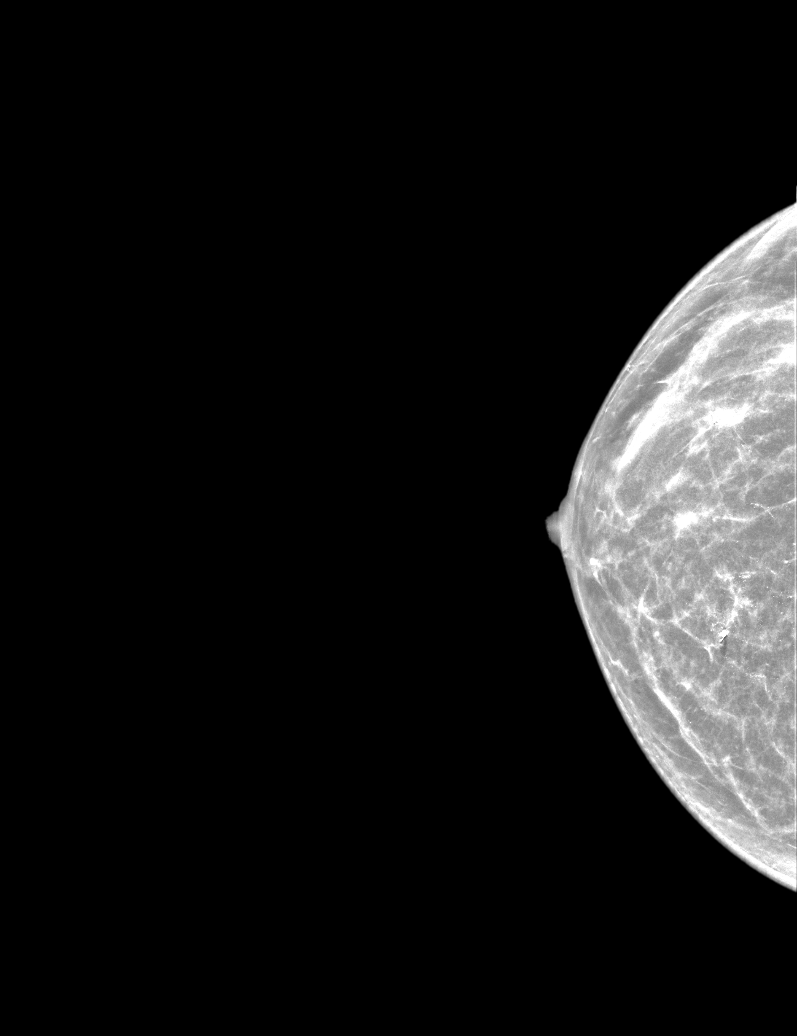

[R MLO synth-2D]
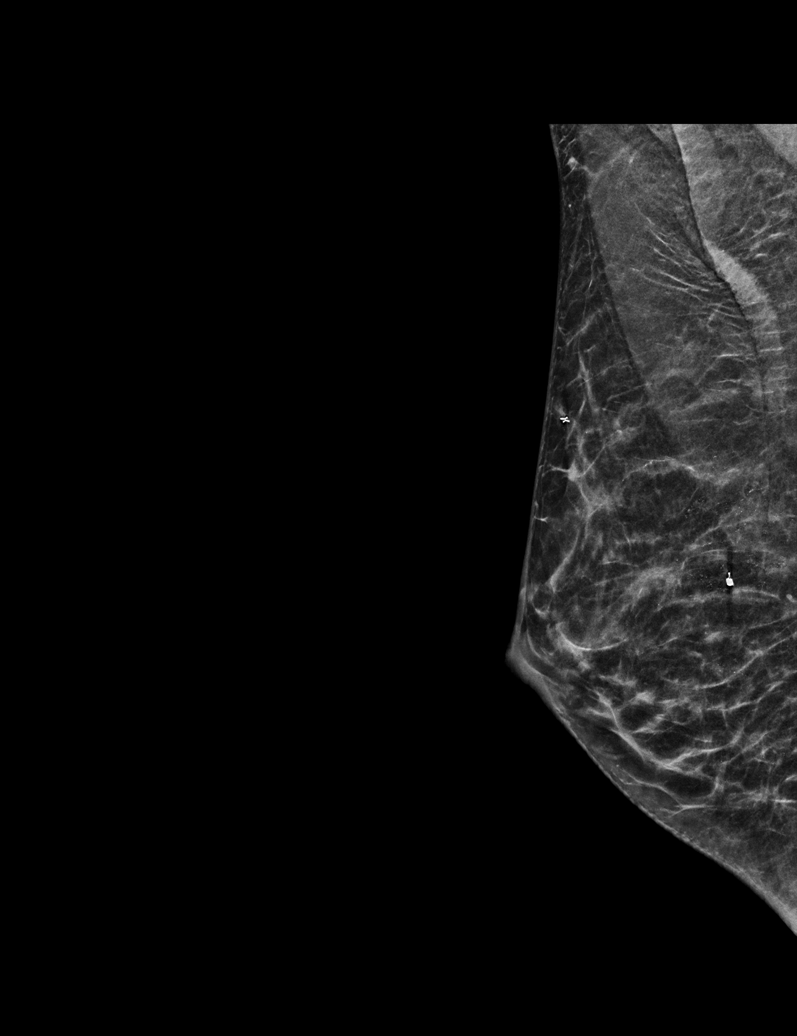

[R CC tomo · tomo slice 23/45.0]
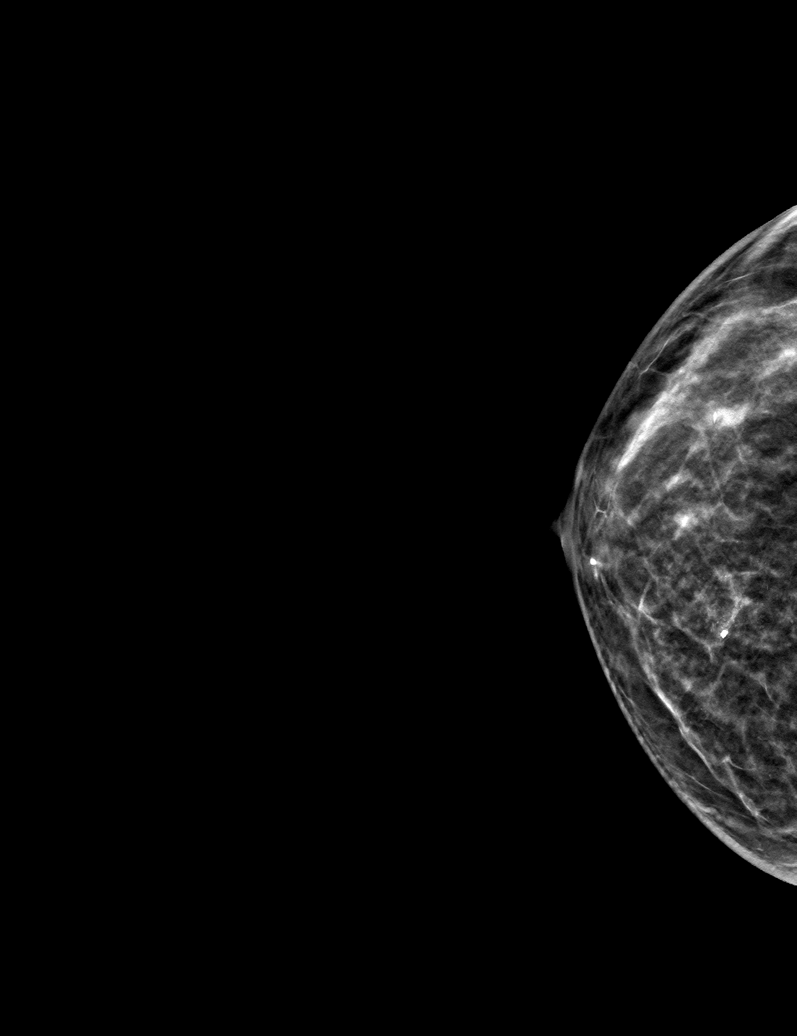

[R MLO tomo · tomo slice 23/44.0]
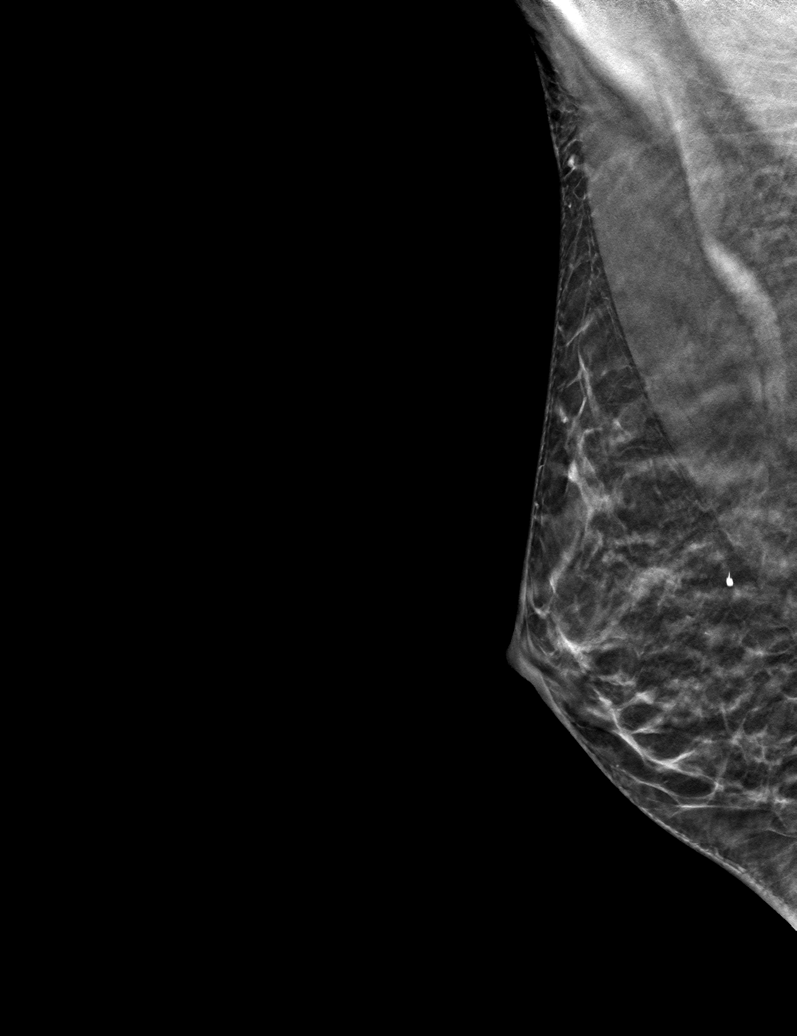

[6 of 14 positions shown; findings below may reference images not displayed]

ACR Breast Density Category c: The breast tissue is heterogeneously
dense, which may obscure small masses.
FINDINGS: There are small round and punctate, smooth and uniform
calcifications in the right breast in the upper inner and upper
outer quadrants, unchanged from the diagnostic exam dated
07/08/2018, but also similar to the exam from 11/15/2015. Given this
lack of change, the calcifications distribution and morphology, and
the benign pathology from the 2 areas that were biopsied, these are
all considered benign. There are no masses or new or suspicious
calcifications.

Mammographic images were processed with CAD.
IMPRESSION: 1. No evidence of breast malignancy.
2. Benign right breast calcifications.

RECOMMENDATION:
Screening mammogram in June 2019.(Code:09-1-DFB)

I have discussed the findings and recommendations with the patient.
Results were also provided in writing at the conclusion of the
visit. If applicable, a reminder letter will be sent to the patient
regarding the next appointment.

BI-RADS CATEGORY  2: Benign.

## 2021-03-24 ENCOUNTER — Telehealth: Payer: Self-pay | Admitting: Medical

## 2021-03-24 ENCOUNTER — Other Ambulatory Visit: Payer: Self-pay

## 2021-04-14 ENCOUNTER — Ambulatory Visit: Payer: BC Managed Care – PPO | Admitting: Family Medicine

## 2021-04-14 ENCOUNTER — Encounter: Payer: Self-pay | Admitting: Family Medicine

## 2021-04-14 ENCOUNTER — Other Ambulatory Visit: Payer: Self-pay

## 2021-04-14 VITALS — BP 120/70 | HR 78 | Wt 131.8 lb

## 2021-04-14 DIAGNOSIS — R7303 Prediabetes: Secondary | ICD-10-CM | POA: Diagnosis not present

## 2021-04-14 DIAGNOSIS — R350 Frequency of micturition: Secondary | ICD-10-CM | POA: Diagnosis not present

## 2021-04-14 DIAGNOSIS — K5900 Constipation, unspecified: Secondary | ICD-10-CM

## 2021-04-14 LAB — POCT GLYCOSYLATED HEMOGLOBIN (HGB A1C): Hemoglobin A1C: 5.7 % — AB (ref 4.0–5.6)

## 2021-04-14 LAB — POCT URINALYSIS DIP (PROADVANTAGE DEVICE)
Bilirubin, UA: NEGATIVE
Blood, UA: NEGATIVE
Glucose, UA: NEGATIVE mg/dL
Ketones, POC UA: NEGATIVE mg/dL
Leukocytes, UA: NEGATIVE
Nitrite, UA: NEGATIVE
Protein Ur, POC: NEGATIVE mg/dL
Specific Gravity, Urine: 1.015
Urobilinogen, Ur: NEGATIVE
pH, UA: 6 (ref 5.0–8.0)

## 2021-04-14 LAB — GLUCOSE, POCT (MANUAL RESULT ENTRY): POC Glucose: 108 mg/dl — AB (ref 70–99)

## 2021-04-14 NOTE — Progress Notes (Signed)
   Subjective:    Patient ID: Jennifer Williams, female    DOB: [birth year], 55 y.o.   MRN: [id number]  HPI Chief Complaint  Patient presents with  . Urinary Frequency    Urinary frequency- within the last week. Sometimes finger tips will go numb. Has a family hx of diabetes. No yeast.    Complains of a 1-2 week history of urinary frequency. Occasionally feels like she does not empty her bladder.   History of constipation and this has been worse over the past couple weeks.   States she had a colonoscopy in 2020.   Is not drinking more than usual.   Hx of prediabetes. No polydipsia, blurred vision, or weight changes.   No fever, chills, chest pain, cough, shortness of breath, abdominal pain, back pain, N/V/D, urinary urgency, incontinence or dysuria.   Reviewed allergies, medications, past medical, surgical, family, and social history.    Review of Systems Pertinent positives and negatives in the history of present illness.     Objective:   Physical Exam Constitutional:      General: She is not in acute distress.    Appearance: Normal appearance. She is not ill-appearing.  Cardiovascular:     Rate and Rhythm: Normal rate and regular rhythm.     Pulses: Normal pulses.  Pulmonary:     Effort: Pulmonary effort is normal.     Breath sounds: Normal breath sounds.  Abdominal:     General: Abdomen is flat. Bowel sounds are normal. There is no distension.     Palpations: Abdomen is soft.     Tenderness: There is no abdominal tenderness. There is no right CVA tenderness, left CVA tenderness, guarding or rebound.  Musculoskeletal:     Cervical back: Normal range of motion and neck supple.  Skin:    General: Skin is warm and dry.  Neurological:     General: No focal deficit present.     Mental Status: She is alert and oriented to person, place, and time.    BP 120/70   Pulse 78   Wt 131 lb 12.8 oz (59.8 kg)   BMI 20.04 kg/m       Assessment & Plan:  Constipation,  unspecified constipation type  Frequent urination - Plan: POCT glucose (manual entry), HgB A1c, POCT Urinalysis DIP (Proadvantage Device), Urine Culture  Prediabetes  PCOT 108 Hgb A1c 5.7% and still in prediabetes range. Continue with low sugar and carbohydrate diet. Stay active and good hydration.  UA negative. Urine culture sent.  Suspect her urinary symptoms have been related to constipation.  Counseling on keeping her stools soft and eliminating regularly. Discussed fiber, probiotic, Miralax, stool softeners and if none of these are helping may try Linzess or refer back to GI.  She will let me know if urinary symptoms do not improve or if they worsen.

## 2021-04-16 LAB — URINE CULTURE

## 2021-08-21 LAB — RESULTS CONSOLE HPV: CHL HPV: NEGATIVE

## 2021-08-21 LAB — HM PAP SMEAR: HM Pap smear: NORMAL

## 2021-09-19 ENCOUNTER — Encounter: Payer: Self-pay | Admitting: Internal Medicine

## 2021-10-10 ENCOUNTER — Encounter: Payer: Self-pay | Admitting: Family Medicine

## 2021-10-10 ENCOUNTER — Ambulatory Visit: Payer: BC Managed Care – PPO | Admitting: Family Medicine

## 2021-10-10 ENCOUNTER — Other Ambulatory Visit: Payer: Self-pay

## 2021-10-10 VITALS — BP 118/78 | HR 85 | Temp 97.9°F | Wt 134.8 lb

## 2021-10-10 DIAGNOSIS — Z23 Encounter for immunization: Secondary | ICD-10-CM

## 2021-10-10 DIAGNOSIS — H9203 Otalgia, bilateral: Secondary | ICD-10-CM

## 2021-10-10 DIAGNOSIS — J309 Allergic rhinitis, unspecified: Secondary | ICD-10-CM | POA: Diagnosis not present

## 2021-10-10 NOTE — Progress Notes (Signed)
   Subjective:    Patient ID: Barby Colvard, female    DOB: Mar 17, 1966, 55 y.o.   MRN: [id number]  HPI She complains of a several week history of ear discomfort and a feeling of congestion.  She has had difficulty with this in the past and apparently evaluation has shown nothing.  No sore throat, cough, fever, chills.  She does have underlying allergies and is presently on Zyrtec as well as taking Sudafed.   Review of Systems     Objective:   Physical Exam Alert and in no distress.  Both TMs and canals are totally normal.  Neck is supple without adenopathy.  Throat is clear.       Assessment & Plan:  Need for influenza vaccination - Plan: Flu Vaccine QUAD 71mo+IM (Fluarix, Fluzone & Alfiuria Quad PF)  Allergic rhinitis, unspecified seasonality, unspecified trigger  Otalgia of both ears I explained that at this point I see nothing to treat but did recommend continuing on the antihistamine and decongestant.  If needed we can refer to ENT if she would like.

## 2021-10-12 ENCOUNTER — Telehealth: Payer: Self-pay

## 2021-10-12 NOTE — Telephone Encounter (Signed)
Pt. Was told to call back today if she wanted a referral to ENT per Dr. Redmond School. She stats that she does want that referral to ENT.

## 2021-10-23 ENCOUNTER — Other Ambulatory Visit: Payer: Self-pay

## 2021-10-23 ENCOUNTER — Telehealth: Payer: Self-pay

## 2021-10-23 DIAGNOSIS — H9203 Otalgia, bilateral: Secondary | ICD-10-CM

## 2021-10-23 NOTE — Telephone Encounter (Signed)
Pt called back and advised that she does want the ENT referral and it was placed. [place]

## 2022-08-02 ENCOUNTER — Other Ambulatory Visit: Payer: Self-pay | Admitting: Obstetrics & Gynecology

## 2022-08-02 DIAGNOSIS — Z1231 Encounter for screening mammogram for malignant neoplasm of breast: Secondary | ICD-10-CM

## 2022-08-24 ENCOUNTER — Encounter: Payer: Self-pay | Admitting: Medical

## 2022-08-24 ENCOUNTER — Telehealth: Payer: BC Managed Care – PPO | Admitting: Medical

## 2022-08-24 VITALS — BP 100/64 | Ht 68.0 in | Wt 128.0 lb

## 2022-08-24 DIAGNOSIS — J019 Acute sinusitis, unspecified: Secondary | ICD-10-CM | POA: Diagnosis not present

## 2022-08-24 MED ORDER — AMOXICILLIN 875 MG PO TABS: 875.0000 mg | ORAL_TABLET | Freq: Two times a day (BID) | ORAL | 0 refills | Status: AC

## 2022-08-24 NOTE — Progress Notes (Signed)
Subjective:     Patient ID: Jennifer Williams, female   DOB: 1966-06-07, 56 y.o.   MRN: [id number]  This visit type was conducted due to national recommendations for restrictions regarding the COVID-19 Pandemic (e.g. social distancing) in an effort to limit this patient's exposure and mitigate transmission in our community.  Due to their co-morbid illnesses, this patient is at least at moderate risk for complications without adequate follow up.  This format is felt to be most appropriate for this patient at this time.    Documentation for virtual audio and video telecommunications through [facility] encounter:  The patient was located at home. The provider was located in the office. The patient did consent to this visit and is aware of possible charges through their insurance for this visit.  The other persons participating in this telemedicine service were none. Time spent on call was 20 minutes and in review of previous records 20 minutes total.  This virtual service is not related to other E/M service within previous 7 days.   HPI Chief Complaint  Patient presents with   other    Sinus pain and pressure behind eyes started in past week   Virtual for sinus infection.  Having pressure behind eyes, ears stopped up, which is typical of her prior sinus infections and areas.  No sore throat, no fever, no chills.  No cough.  No NVD.  No change in smell or taste.  Using sudafed.   No sick contacts.  This feels like her typical sinus infection.  Hasnt done a covid test.   She typically gets this about once a year.  Her last one was about a year ago.  No other aggravating or relieving factors. No other complaint.  Past Medical History:  Diagnosis Date   Allergy    Elevated hemoglobin A1c measurement    Elevated serum creatinine    Current Outpatient Medications on File Prior to Visit  Medication Sig Dispense Refill   cetirizine (ZYRTEC) 10 MG tablet Take by mouth.     levonorgestrel (MIRENA,  52 MG,) 20 MCG/24HR IUD Mirena 20 mcg/24 hours (5 yrs) 52 mg intrauterine device     No current facility-administered medications on file prior to visit.    Review of Systems As in subjective    Objective:   Physical Exam Due to coronavirus pandemic stay at home measures, patient visit was virtual and they were not examined in person.   Gen: wd, wn, nad No coughing, no labored breathing     Assessment:     Encounter Diagnosis  Name Primary?   Acute sinusitis, recurrence not specified, unspecified location Yes       Plan:     We discussed her symptoms and concerns and prior sinus infection history that is similar.  Advise she do a COVID test at home just to help further evaluate.  If positive for COVID call back.  Given her prior similar history and given current symptoms we will go ahead and use the following recommendations including good water intake, nasal saline flush, salt water gargles if sore throat, continue Sudafed the next several days, add amoxicillin below.  If not much improved or not resolved within the next week then call back  Jennifer Williams was seen today for other.  Diagnoses and all orders for this visit:  Acute sinusitis, recurrence not specified, unspecified location  Other orders -     amoxicillin (AMOXIL) 875 MG tablet; Take 1 tablet (875 mg total) by mouth 2 (two)  times daily for 10 days.  F/u prn

## 2022-08-27 ENCOUNTER — Telehealth: Payer: BC Managed Care – PPO | Admitting: Medical

## 2022-08-28 ENCOUNTER — Encounter: Payer: Self-pay | Admitting: Family Medicine

## 2022-08-29 ENCOUNTER — Ambulatory Visit: Payer: BC Managed Care – PPO

## 2022-09-26 ENCOUNTER — Encounter: Payer: Self-pay | Admitting: Internal Medicine

## 2022-09-28 ENCOUNTER — Ambulatory Visit
Admission: RE | Admit: 2022-09-28 | Discharge: 2022-09-28 | Disposition: A | Discharge status: Home / Self Care | Payer: BC Managed Care – PPO | Source: Ambulatory Visit | Attending: Obstetrics & Gynecology | Admitting: Obstetrics & Gynecology

## 2022-09-28 DIAGNOSIS — Z1231 Encounter for screening mammogram for malignant neoplasm of breast: Secondary | ICD-10-CM

## 2022-09-28 LAB — HM MAMMOGRAPHY

## 2022-10-03 ENCOUNTER — Encounter: Payer: Self-pay | Admitting: Internal Medicine

## 2022-10-27 ENCOUNTER — Telehealth: Payer: BC Managed Care – PPO | Admitting: Physician Assistant

## 2022-10-27 DIAGNOSIS — R300 Dysuria: Secondary | ICD-10-CM | POA: Diagnosis not present

## 2022-10-27 MED ORDER — CEPHALEXIN 500 MG PO CAPS: 500.0000 mg | ORAL_CAPSULE | Freq: Two times a day (BID) | ORAL | 0 refills | Status: DC

## 2022-10-27 NOTE — Progress Notes (Signed)
Virtual Visit Consent   Jennifer Williams, you are scheduled for a virtual visit with a [facility] provider today. Just as with appointments in the office, your consent must be obtained to participate. Your consent will be active for this visit and any virtual visit you may have with one of our providers in the next 365 days. If you have a MyChart account, a copy of this consent can be sent to you electronically.  As this is a virtual visit, video technology does not allow for your provider to perform a traditional examination. This may limit your provider's ability to fully assess your condition. If your provider identifies any concerns that need to be evaluated in person or the need to arrange testing (such as labs, EKG, etc.), we will make arrangements to do so. Although advances in technology are sophisticated, we cannot ensure that it will always work on either your end or our end. If the connection with a video visit is poor, the visit may have to be switched to a telephone visit. With either a video or telephone visit, we are not always able to ensure that we have a secure connection.  By engaging in this virtual visit, you consent to the provision of healthcare and authorize for your insurance to be billed (if applicable) for the services provided during this visit. Depending on your insurance coverage, you may receive a charge related to this service.  I need to obtain your verbal consent now. Are you willing to proceed with your visit today? Jennifer Williams has provided verbal consent on 10/27/2022 for a virtual visit (video or telephone). Jennifer Williams, [place]  Date: 10/27/2022 10:46 AM  Virtual Visit via Video Note   I, Jennifer Williams, connected with  Jennifer Williams  ([id number], 02-04-1966) on 10/27/22 at 11:15 AM EST by a video-enabled telemedicine application and verified that I am speaking with the correct person using two identifiers.  Location: Patient: Virtual Visit Location Patient:  Home Provider: Virtual Visit Location Provider: Home Office   I discussed the limitations of evaluation and management by telemedicine and the availability of in person appointments. The patient expressed understanding and agreed to proceed.    History of Present Illness: Jennifer Williams is a 56 y.o. who identifies as a female who was assigned female at birth, and is being seen today for UTI sx.  Urgency and pain started a few days ago. She has hx of UTI, last one was last year, started with similar symptoms. Left side has hurting a bit this morning but this resolved. She is drinking plenty of water.  Denies: fevers, chills, n/v/d, unusual severe back pain, vaginal discharge, concerns for pregnancy, abdominal pain  HPI: HPI  Problems:  Patient Active Problem List   Diagnosis Date Noted   Ganglion cyst of finger 04/14/2019   Atypical nevi 04/14/2019   Elevated serum creatinine    Elevated hemoglobin A1c measurement     Allergies: No Known Allergies Medications:  Current Outpatient Medications:    cephALEXin (KEFLEX) 500 MG capsule, Take 1 capsule (500 mg total) by mouth 2 (two) times daily., Disp: 14 capsule, Rfl: 0   cetirizine (ZYRTEC) 10 MG tablet, Take by mouth., Disp: , Rfl:    levonorgestrel (MIRENA, 52 MG,) 20 MCG/24HR IUD, Mirena 20 mcg/24 hours (5 yrs) 52 mg intrauterine device, Disp: , Rfl:   Observations/Objective: Patient is well-developed, well-nourished in no acute distress.  Resting comfortably  at home.  Head is normocephalic, atraumatic.  No labored breathing.  Speech  is clear and coherent with logical content.  Patient is alert and oriented at baseline.   Assessment and Plan: 1. Dysuria No red flags Suspect acute cystitis Start keflex 500 mg BID x 7 days Follow-up with PCP in a few days if no improvement or other concerns Push fluids  Follow Up Instructions: I discussed the assessment and treatment plan with the patient. The patient was provided an  opportunity to ask questions and all were answered. The patient agreed with the plan and demonstrated an understanding of the instructions.  A copy of instructions were sent to the patient via MyChart unless otherwise noted below.   The patient was advised to call back or seek an in-person evaluation if the symptoms worsen or if the condition fails to improve as anticipated.  Time:  I spent 5-10 minutes with the patient via telehealth technology discussing the above problems/concerns.    Jennifer Williams, [place]

## 2023-10-22 ENCOUNTER — Ambulatory Visit: Payer: BC Managed Care – PPO | Admitting: Family Medicine

## 2023-10-22 ENCOUNTER — Encounter: Payer: Self-pay | Admitting: Family Medicine

## 2023-10-22 VITALS — BP 118/72 | HR 84 | Temp 98.4°F | Ht 68.0 in | Wt 130.8 lb

## 2023-10-22 DIAGNOSIS — J301 Allergic rhinitis due to pollen: Secondary | ICD-10-CM | POA: Insufficient documentation

## 2023-10-22 DIAGNOSIS — J019 Acute sinusitis, unspecified: Secondary | ICD-10-CM

## 2023-10-22 MED ORDER — AMOXICILLIN-POT CLAVULANATE 875-125 MG PO TABS: 1.0000 | ORAL_TABLET | Freq: Two times a day (BID) | ORAL | 0 refills | Status: DC

## 2023-10-22 NOTE — Progress Notes (Signed)
Short-term use of something like Afrin okay did you the Mucinex nose Freda for a couple days during this because I know it is like Afrin and then you can use a term that can can help you ".  Today is probably the first day that I have been able to try and pop my ears at this bedside like Afrin twice a day for couple 3 to 4 days since he can get things open up the problem discussed with twice a day to 3 times a day and then you can get a mask that it is already like be careful you should use this longer than  Subjective:    Patient ID: Jennifer Williams, female    DOB: [birth year], 57 y.o.   MRN: [id number]  HPI She states that 1 week ago she developed sore throat, nasal congestion, postnasal drainage with slight bilateral earache and frontal sinus pressure.  No fever or chills.  She does not smoke.  She did have a negative COVID test.  She does have underlying allergies and is using Zyrtec and Flonase.   Review of Systems     Objective:    Physical Exam Alert and in no distress. Tympanic membranes and canals are normal. Pharyngeal area is normal. Neck is supple without adenopathy or thyromegaly. Cardiac exam shows a regular sinus rhythm without murmurs or gallops. Lungs are clear to auscultation.        Assessment & Plan:  Acute sinusitis, recurrence not specified, unspecified location - Plan: amoxicillin-clavulanate (AUGMENTIN) 875-125 MG tablet  Allergic rhinitis due to pollen, unspecified seasonality Did also say she could use Afrin nasal spray judiciously for several days to help with eustachian tube dysfunction.

## 2023-10-29 ENCOUNTER — Other Ambulatory Visit: Payer: Self-pay | Admitting: Obstetrics & Gynecology

## 2023-10-29 DIAGNOSIS — Z0000 Encounter for general adult medical examination without abnormal findings: Secondary | ICD-10-CM

## 2023-11-08 ENCOUNTER — Encounter: Payer: Self-pay | Admitting: Nurse Practitioner

## 2023-11-08 ENCOUNTER — Ambulatory Visit: Payer: BC Managed Care – PPO | Admitting: Nurse Practitioner

## 2023-11-08 VITALS — BP 116/74 | HR 63 | Temp 98.5°F | Wt 134.0 lb

## 2023-11-08 DIAGNOSIS — B3741 Candidal cystitis and urethritis: Secondary | ICD-10-CM | POA: Diagnosis not present

## 2023-11-08 DIAGNOSIS — R300 Dysuria: Secondary | ICD-10-CM | POA: Diagnosis not present

## 2023-11-08 DIAGNOSIS — Z139 Encounter for screening, unspecified: Secondary | ICD-10-CM

## 2023-11-08 LAB — POCT URINALYSIS DIP (PROADVANTAGE DEVICE)
Bilirubin, UA: NEGATIVE
Blood, UA: NEGATIVE
Glucose, UA: NEGATIVE mg/dL
Ketones, POC UA: NEGATIVE mg/dL
Leukocytes, UA: NEGATIVE
Nitrite, UA: NEGATIVE
Protein Ur, POC: NEGATIVE mg/dL
Specific Gravity, Urine: 1.005
Urobilinogen, Ur: 0.2
pH, UA: 6 (ref 5.0–8.0)

## 2023-11-08 MED ORDER — PHENAZOPYRIDINE HCL 200 MG PO TABS: 200.0000 mg | ORAL_TABLET | Freq: Three times a day (TID) | ORAL | 0 refills | Status: AC | PRN

## 2023-11-08 MED ORDER — NITROFURANTOIN MONOHYD MACRO 100 MG PO CAPS: 100.0000 mg | ORAL_CAPSULE | Freq: Two times a day (BID) | ORAL | 0 refills | Status: AC

## 2023-11-08 MED ORDER — FLUCONAZOLE 150 MG PO TABS: ORAL_TABLET | ORAL | 0 refills | Status: AC

## 2023-11-08 NOTE — Progress Notes (Unsigned)
  Tollie Eth, DNP, AGNP-c [facility] [address] [place], [place] [address] [phone]   ACUTE VISIT- ESTABLISHED PATIENT  Blood pressure 116/74, pulse 63, temperature 98.5 F (36.9 C), temperature source Oral, weight 134 lb (60.8 kg), SpO2 98%.  Subjective:  HPI Klover Morss is a 57 y.o. female presents to day for evaluation of acute concern(s).   Several utis in the last year   ROS negative except for what is listed in HPI. History, Medications, Surgery, SDOH, and Family History reviewed and updated as appropriate.  Objective:  Physical Exam       Assessment & Plan:   Problem List Items Addressed This Visit   None     Tollie Eth, DNP, AGNP-c

## 2023-11-09 LAB — COMPREHENSIVE METABOLIC PANEL
ALT: 26 IU/L (ref 0–32)
AST: 20 IU/L (ref 0–40)
Albumin: 4.4 g/dL (ref 3.8–4.9)
Alkaline Phosphatase: 47 IU/L (ref 44–121)
BUN/Creatinine Ratio: 14 (ref 9–23)
BUN: 10 mg/dL (ref 6–24)
Bilirubin Total: 0.4 mg/dL (ref 0.0–1.2)
CO2: 26 mmol/L (ref 20–29)
Calcium: 9.9 mg/dL (ref 8.7–10.2)
Chloride: 102 mmol/L (ref 96–106)
Creatinine, Ser: 0.71 mg/dL (ref 0.57–1.00)
Globulin, Total: 2 g/dL (ref 1.5–4.5)
Glucose: 89 mg/dL (ref 70–99)
Potassium: 4.2 mmol/L (ref 3.5–5.2)
Sodium: 140 mmol/L (ref 134–144)
Total Protein: 6.4 g/dL (ref 6.0–8.5)
eGFR: 100 mL/min/{1.73_m2} (ref >59)

## 2023-11-09 LAB — CBC WITH DIFFERENTIAL/PLATELET
Basophils Absolute: 0.1 10*3/uL (ref 0.0–0.2)
Basos: 2 %
EOS (ABSOLUTE): 0.2 10*3/uL (ref 0.0–0.4)
Eos: 4 %
Hematocrit: 39.7 % (ref 34.0–46.6)
Hemoglobin: 13.3 g/dL (ref 11.1–15.9)
Immature Grans (Abs): 0 10*3/uL (ref 0.0–0.1)
Immature Granulocytes: 0 %
Lymphocytes Absolute: 1.6 10*3/uL (ref 0.7–3.1)
Lymphs: 40 %
MCH: 30.7 pg (ref 26.6–33.0)
MCHC: 33.5 g/dL (ref 31.5–35.7)
MCV: 92 fL (ref 79–97)
Monocytes Absolute: 0.3 10*3/uL (ref 0.1–0.9)
Monocytes: 6 %
Neutrophils Absolute: 2 10*3/uL (ref 1.4–7.0)
Neutrophils: 48 %
Platelets: 294 10*3/uL (ref 150–450)
RBC: 4.33 x10E6/uL (ref 3.77–5.28)
RDW: 12 % (ref 11.7–15.4)
WBC: 4.1 10*3/uL (ref 3.4–10.8)

## 2023-11-09 LAB — HEMOGLOBIN A1C
Est. average glucose Bld gHb Est-mCnc: 120 mg/dL
Hgb A1c MFr Bld: 5.8 % — ABNORMAL HIGH (ref 4.8–5.6)

## 2023-11-09 LAB — VITAMIN D 25 HYDROXY (VIT D DEFICIENCY, FRACTURES): Vit D, 25-Hydroxy: 42.7 ng/mL (ref 30.0–100.0)

## 2023-11-10 LAB — URINE CULTURE

## 2023-11-12 DIAGNOSIS — R300 Dysuria: Secondary | ICD-10-CM | POA: Insufficient documentation

## 2023-11-12 NOTE — Assessment & Plan Note (Signed)
UA normal in the office, however, after discussing symptoms with patient I suspect a component of vaginal atrophy and possible candidal cystitis. We will treat today and monitor.

## 2023-11-20 ENCOUNTER — Ambulatory Visit: Payer: BC Managed Care – PPO

## 2023-11-28 ENCOUNTER — Ambulatory Visit
Admission: RE | Admit: 2023-11-28 | Discharge: 2023-11-28 | Disposition: A | Discharge status: Home / Self Care | Payer: 59 | Source: Ambulatory Visit | Attending: Obstetrics & Gynecology | Admitting: Obstetrics & Gynecology

## 2023-11-28 DIAGNOSIS — Z0000 Encounter for general adult medical examination without abnormal findings: Secondary | ICD-10-CM

## 2024-09-28 LAB — HM PAP SMEAR: HPV, high-risk: NEGATIVE

## 2024-10-14 ENCOUNTER — Other Ambulatory Visit: Payer: Self-pay | Admitting: Obstetrics & Gynecology

## 2024-10-14 DIAGNOSIS — Z1231 Encounter for screening mammogram for malignant neoplasm of breast: Secondary | ICD-10-CM

## 2024-12-01 ENCOUNTER — Ambulatory Visit
